# Patient Record
Sex: Male | Born: 1975 | Race: White | Marital: Married
Health system: Southern US, Community
[De-identification: ages and names within clinical notes are randomized; demographics above are authoritative.]

---

## 2014-05-31 ENCOUNTER — Other Ambulatory Visit: Payer: Self-pay | Admitting: Neurological Surgery

## 2014-05-31 DIAGNOSIS — M5412 Radiculopathy, cervical region: Secondary | ICD-10-CM

## 2014-06-10 ENCOUNTER — Other Ambulatory Visit: Payer: Self-pay

## 2014-06-24 ENCOUNTER — Ambulatory Visit
Admission: RE | Admit: 2014-06-24 | Discharge: 2014-06-24 | Disposition: A | Discharge status: Home / Self Care | Payer: BLUE CROSS/BLUE SHIELD | Source: Ambulatory Visit | Attending: Neurological Surgery | Admitting: Neurological Surgery

## 2014-06-24 DIAGNOSIS — M5412 Radiculopathy, cervical region: Secondary | ICD-10-CM

## 2018-12-11 ENCOUNTER — Other Ambulatory Visit (HOSPITAL_COMMUNITY): Payer: Self-pay | Admitting: Surgery

## 2018-12-17 ENCOUNTER — Other Ambulatory Visit (HOSPITAL_COMMUNITY): Payer: Self-pay | Admitting: Surgery

## 2018-12-17 ENCOUNTER — Other Ambulatory Visit: Payer: Self-pay

## 2018-12-17 ENCOUNTER — Ambulatory Visit (HOSPITAL_COMMUNITY)
Admission: RE | Admit: 2018-12-17 | Discharge: 2018-12-17 | Disposition: A | Discharge status: Home / Self Care | Payer: BC Managed Care – PPO | Source: Ambulatory Visit | Attending: Surgery | Admitting: Surgery

## 2019-01-20 ENCOUNTER — Other Ambulatory Visit: Payer: Self-pay

## 2019-01-20 ENCOUNTER — Encounter: Payer: Self-pay | Admitting: Skilled Nursing Facility1

## 2019-01-20 ENCOUNTER — Encounter: Payer: BLUE CROSS/BLUE SHIELD | Attending: Surgery | Admitting: Skilled Nursing Facility1

## 2019-01-20 DIAGNOSIS — E669 Obesity, unspecified: Secondary | ICD-10-CM | POA: Insufficient documentation

## 2019-01-20 NOTE — Progress Notes (Signed)
Pre-Op Assessment Visit:  Pre-Operative RYGB Surgery  Medical Nutrition Therapy:  Appt start time: 3:05  End time:  4:04  Patient was seen on 01/20/2019 for Pre-Operative Nutrition Assessment. Assessment and letter of approval faxed to [facility] Program coordinator on 01/20/2019    Referral Stated SWL Appointments Required: 6 (unless otherwise told by [facility])  Proposed Surgery Type: RYGB  Pt expectation of surgery: to lose weight  Pt expectation of dietitian: none identified   NUTRITION ASSESSMENT   Anthropometrics  Start weight at [facility]: 327.9 lbs Today's weight: 327.9 lbs BMI: 44.43 kg/m2     Psychosocial/Lifestyle Dx: depression, sleep apnea  Pt began the appointment irritated.   Pt states his depression had felt out of control and states it is due to his weight.  Pt states she has already been through this process so he hopes he does not need another 6 months and he already knows everything anyway.  Pt states he will use the fear of having had surgery to keep him from stepping out of the recommendations.  Pt states he will start talk therapy once his medication sets in. Pt states he is waiting for an appt to get in with a psychiatrist.  Pt states he knows he is a tress eater and it is all because of his weight. Pt states he believes he has a good support system at home.  Pt states he does wear his C-PAP nightly. Pt states he has GERD.  Pt seemed to relax by the end of the appt.   Medications: prozac  Labs:   Notable Signs/Symptoms Iritable (states due to depression)   24-Hr Dietary Recall First Meal: skipped Snack:  Second Meal: skipped Snack:  Third Meal: 6pm: pasta, chicken Snack: chips, cookies,  Beverages: diet soda, coffee, water    Physical Activity  Swimming 7 days a week 30 minutes   Estimated Energy Needs Calories: 1800 Carbohydrate: 200 Protein: 135 Fat: 50   NUTRITION DIAGNOSIS  Overweight/obesity ([place]-3.3)  related to past poor dietary habits and physical inactivity as evidenced by patient w/ planned RYGB surgery following dietary guidelines for continued weight loss.    NUTRITION INTERVENTION  Nutrition counseling (C-1) and education (E-2) to facilitate bariatric surgery goals.   Handouts given during visit include:  . Pre-Op Goals . Bariatric Surgery Protein Shakes . Vitamin and Mineral Options    During the appointment today the following Pre-Op Goals were reviewed with the patient: . Log your food and beverage via an app or pen and paper  . Make healthy food choices . Begin to limit portion sizes . Limited concentrated sugars and fried foods . Keep fat/sugar in the single digits per serving on              food labels . Practice CHEWING your food  (aim for 30 chews per bite or until applesauce consistency) . Practice not drinking 15 minutes before, during, and 30 minutes after each meal/snack . Avoid all carbonated beverages  . Avoid/limit caffeinated beverages  . Avoid all sugar-sweetened beverages . Consume 3 meals per day; eat every 3-5 hours . Make a list of non-food related activities . Aim for 64-100 ounces of FLUID daily  . Aim for at least 60-80 grams of PROTEIN daily . Look for a liquid protein source that contain ?15 g protein and ?5 g carbohydrate  (ex: shakes, drinks, shots)   Change readiness: Contemplative  Demonstrated degree of understanding via: Teach Back  MONITORING & EVALUATION Dietary intake, weekly physical activity, body weight, and pre-op goals reached.    Next Steps  Patient is to call [facility] (once surgery date is scheduled) to be scheduled for Pre-Op Class, which must be at least 2 weeks prior to surgery date or back follow up visit in 1 month

## 2019-02-16 ENCOUNTER — Ambulatory Visit (INDEPENDENT_AMBULATORY_CARE_PROVIDER_SITE_OTHER): Payer: BC Managed Care – PPO | Admitting: Psychology

## 2019-02-16 DIAGNOSIS — F509 Eating disorder, unspecified: Secondary | ICD-10-CM

## 2019-02-24 ENCOUNTER — Other Ambulatory Visit: Payer: Self-pay

## 2019-02-24 ENCOUNTER — Encounter: Payer: Self-pay | Admitting: Dietician

## 2019-02-24 ENCOUNTER — Encounter: Payer: BLUE CROSS/BLUE SHIELD | Attending: Surgery | Admitting: Dietician

## 2019-02-24 DIAGNOSIS — E669 Obesity, unspecified: Secondary | ICD-10-CM | POA: Insufficient documentation

## 2019-02-24 NOTE — Progress Notes (Signed)
Bariatric Supervised Weight Loss Visit Appt Start Time: 2:45pm    End Time: 3:30pm  Planned Surgery: RYGB  Pt Expectation of Surgery/ Goals: to lose weight and be healthy  1st SWL Appointment  Referral originally indicated 6 SWL visits were required, however pt states he spoke with [facility] and determined his previous visits with PCP will fulfill the SWL visit requirements.    NUTRITION ASSESSMENT  Anthropometrics  Start weight at [facility]: 327.9 lbs (date: 01/20/2019) Today's weight: 328.8 lbs Weight change: +1 lb (since previous visit on 01/20/2019) BMI: 44.6 kg/m2    Clinical  Medical Hx: depression, sleep apnea  Medications: Prozac   Lifestyle & Dietary Hx Pt is talkative, and spent the entire 45 minute appointment venting about his frustration with this surgery process and how his psych evaluation went. States he was told he has binge eating disorder and is upset because of this. States that he knows he "binge eats" but does not look at it as a disorder. States he is aware of what healthy/unhealthy foods are and that he knows what he should and should not eat but makes his own decisions anyway. States fear is his motivator for surgery, and that knowing he could physically be in pain or "mess something up" if he eats too much or the wrong thing that these things are motivators to him, not just knowing the right thing to do and not doing it. Pt went on as though he feels like he needs to explain himself and prove that he is ready for surgery, despite not being willing to make any dietary, lifestyle, or mental changes prior to surgery. Rather, surgery will force him to make changes. Current meal pattern is only 1 meal per day, usually no snacks, and very rarely eats breakfast (may have toast or biscuit on the weekends.)   24-Hr Dietary Recall First Meal: usually skips (or toast, or biscuit and gravy)  Snack: skips Second Meal: skips Snack: skips  Third Meal: grilled meat + potato + vegetable   Snack: cookies  Beverages: water, coffee, diet soda   Estimated Energy Needs Calories: 1800 Carbohydrate: 200g Protein: 135g Fat: 50g   NUTRITION DIAGNOSIS  Overweight/obesity ([place]-3.3) related to past poor dietary habits and physical inactivity as evidenced by patient w/ planned RYGB surgery following dietary guidelines for continued weight loss.   NUTRITION INTERVENTION  Nutrition counseling (C-1) and education (E-2) to facilitate bariatric surgery goals.  Handouts Provided Include   Pre-Op Goals  Learning Style & Readiness for Change Teaching method utilized: Visual & Auditory  Demonstrated degree of understanding via: Teach Back  Barriers to learning/adherence to lifestyle change: Contemplative Stage of Change, Depression   MONITORING & EVALUATION Dietary intake, weekly physical activity, body weight, and pre-op goals.   Next Steps  Patient is to return to [facility] for Pre-Op Class >2 weeks prior to surgery once surgery date is determined.

## 2019-02-26 ENCOUNTER — Ambulatory Visit: Payer: BC Managed Care – PPO | Admitting: Psychology

## 2019-03-01 ENCOUNTER — Ambulatory Visit: Payer: BC Managed Care – PPO | Admitting: Psychology

## 2019-03-15 ENCOUNTER — Ambulatory Visit: Payer: BC Managed Care – PPO | Admitting: Psychology

## 2019-03-23 ENCOUNTER — Ambulatory Visit: Payer: BC Managed Care – PPO | Admitting: Psychology

## 2019-04-06 ENCOUNTER — Ambulatory Visit: Payer: BC Managed Care – PPO | Admitting: Psychology

## 2021-02-10 IMAGING — CR CHEST - 2 VIEW
2 series · 2 of 2 positions shown · non-contrast
Comparison: None.

CLINICAL DATA: Preop for bariatric surgery

EXAM:
CHEST - 2 VIEW

[w chest pa]
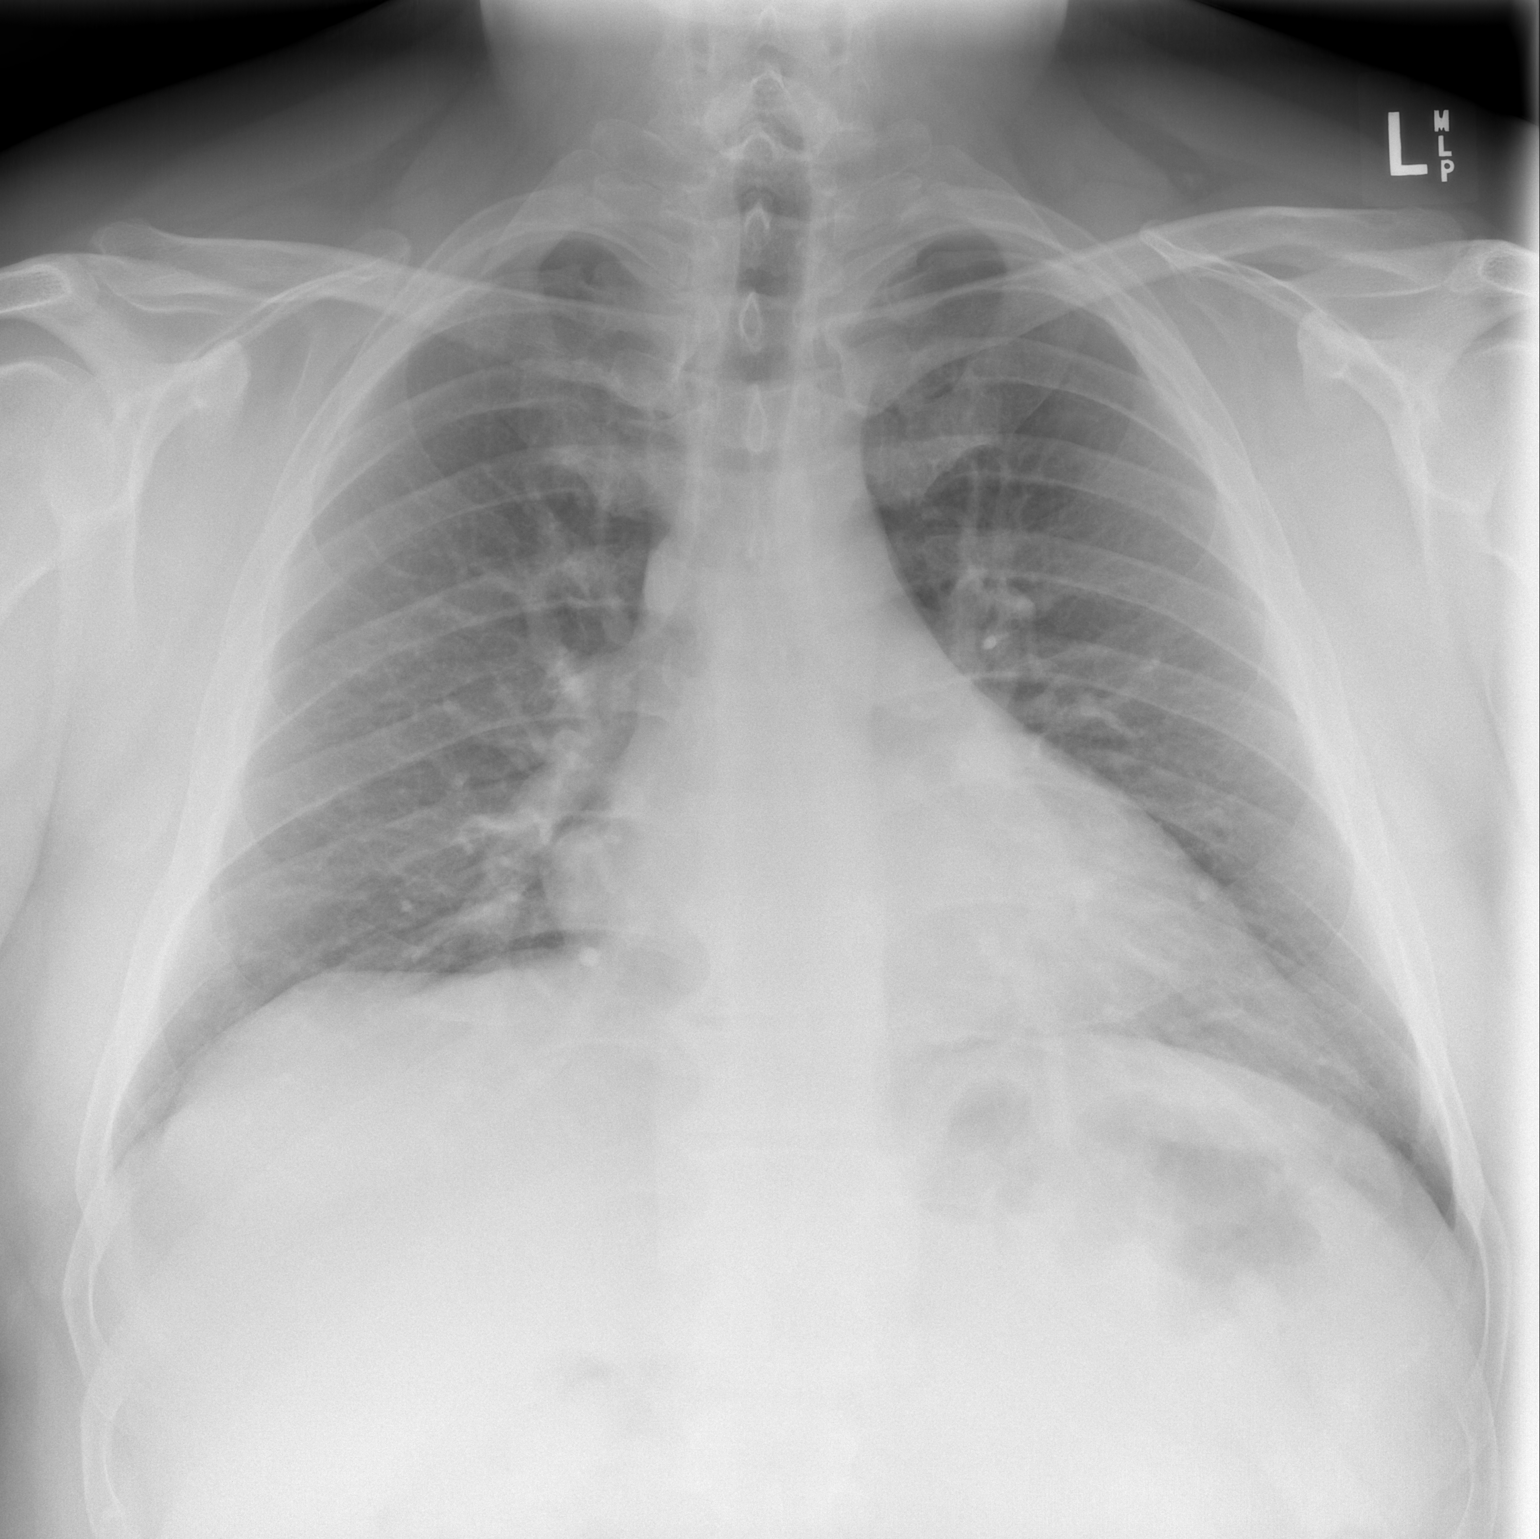

[w chest lat]
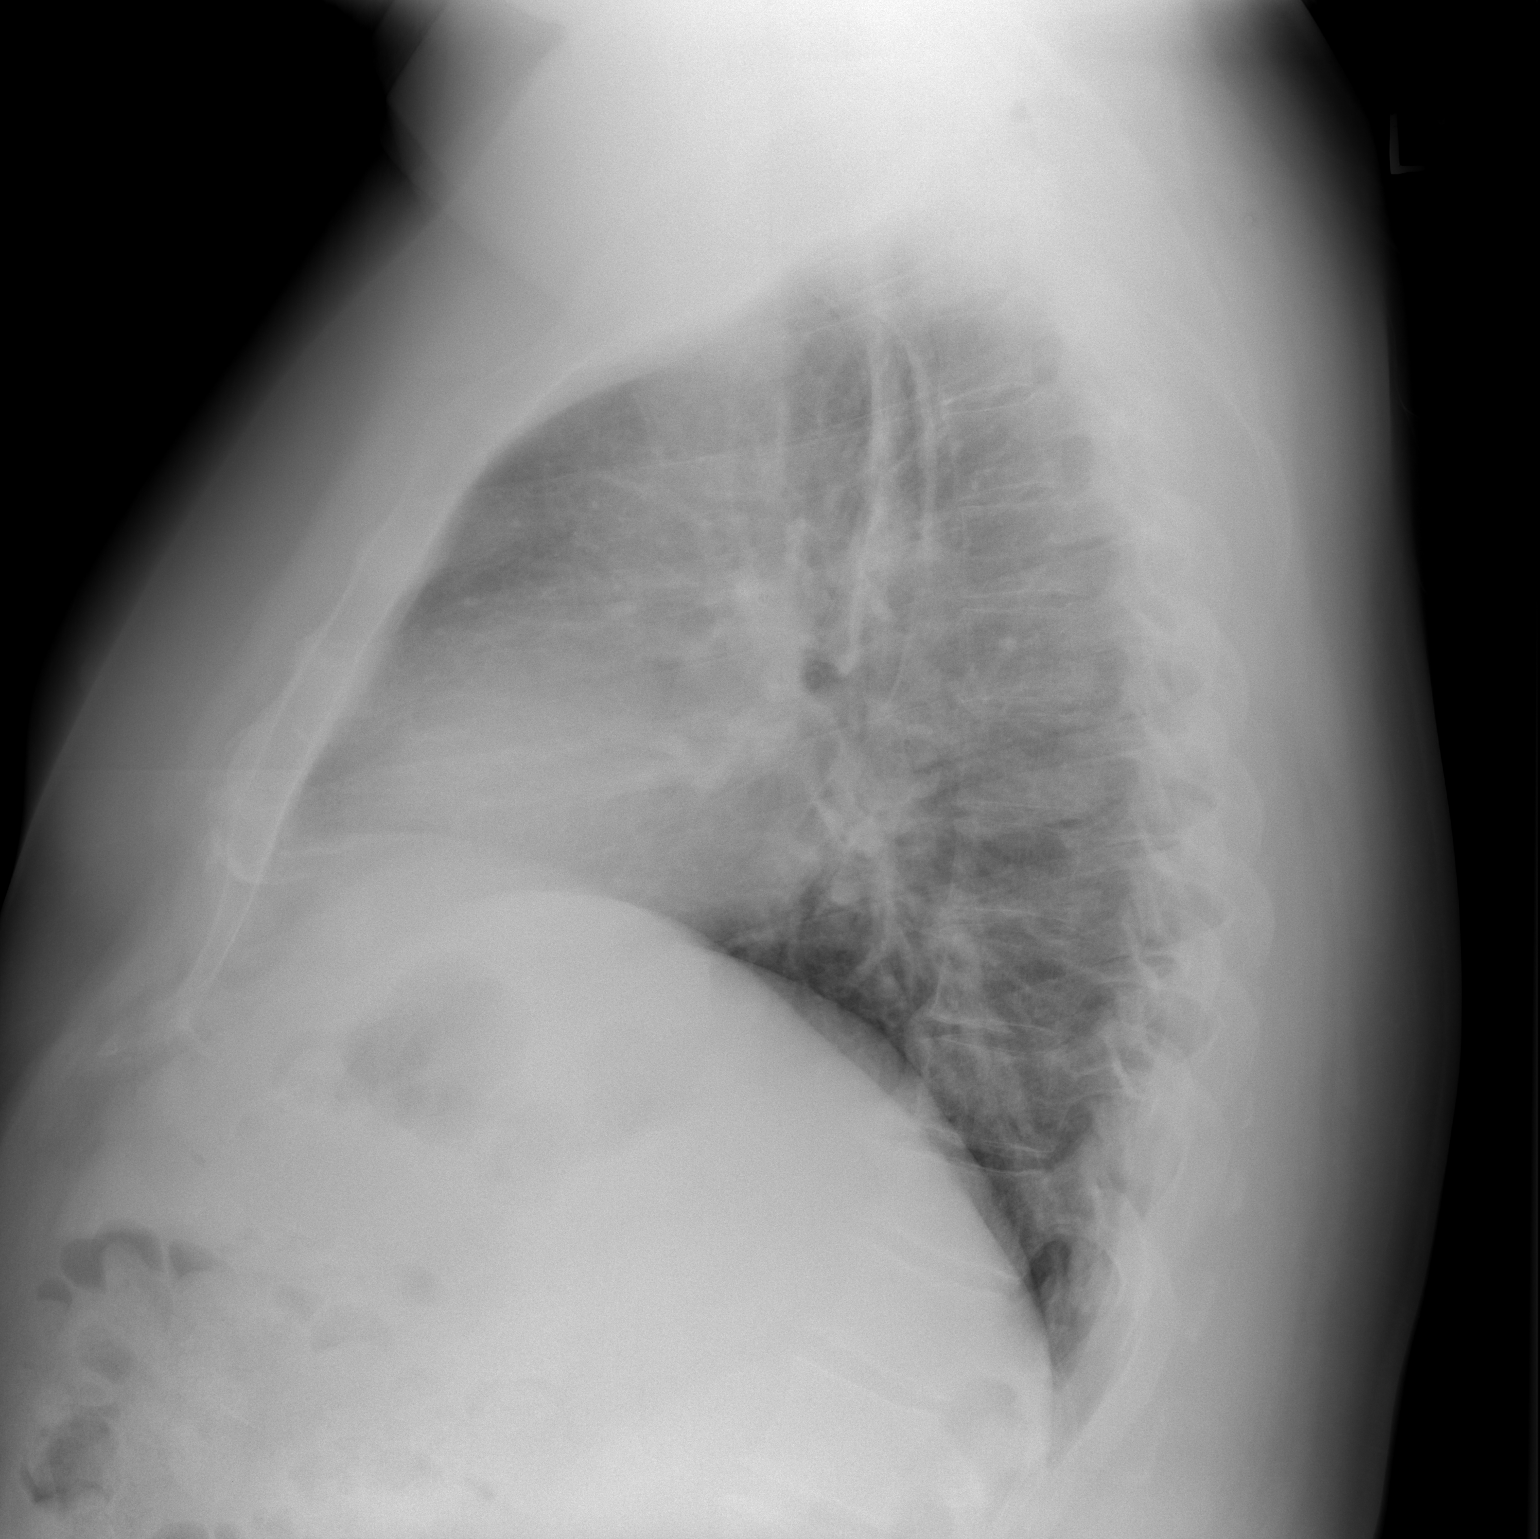

[2 of 2 positions shown; findings below may reference images not displayed]

FINDINGS: The heart size and mediastinal contours are within normal limits.
Both lungs are clear. The visualized skeletal structures are
unremarkable.
IMPRESSION: No active cardiopulmonary disease.

## 2021-02-10 IMAGING — RF UPPER GI SERIES (WITHOUT KUB)
10 of 11 series · 15 of 20 positions shown · non-contrast
Comparison: None.

CLINICAL DATA: Pre bariatric surgery evaluation

EXAM:
UPPER GI SERIES WITH KUB
TECHNIQUE: After obtaining a scout radiograph a routine upper GI series was
performed using thin barium
FLUOROSCOPY TIME:  Fluoroscopy Time:  1.2 minute
Radiation Exposure Index (if provided by the fluoroscopic device):
190.1 mGy
Number of Acquired Spot Images: 0

[Series 1: t abdomen supine · 0.15mm/px · 1 of 1 slices shown]
[im 1/1]
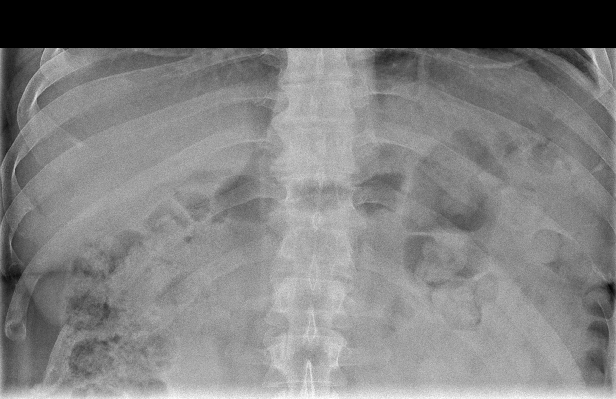

[Series 3: fluoro_barium 2fps_bw · 0.17mm/px · 2 of 2 frames shown (1 of 9)]
[frame 1/2]
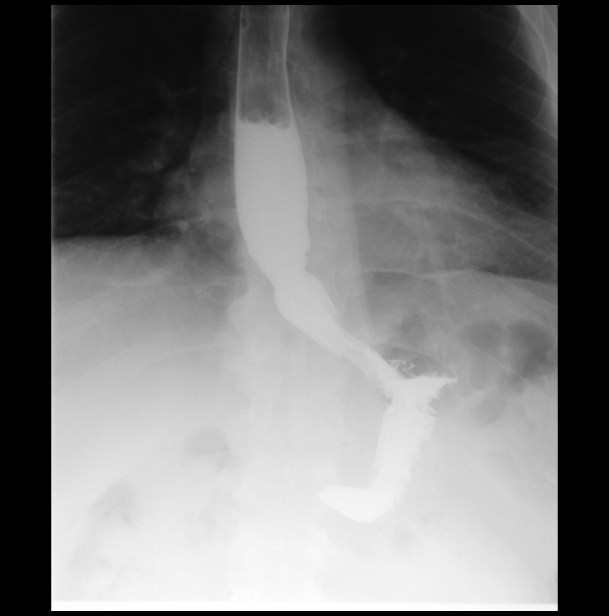
[frame 2/2]
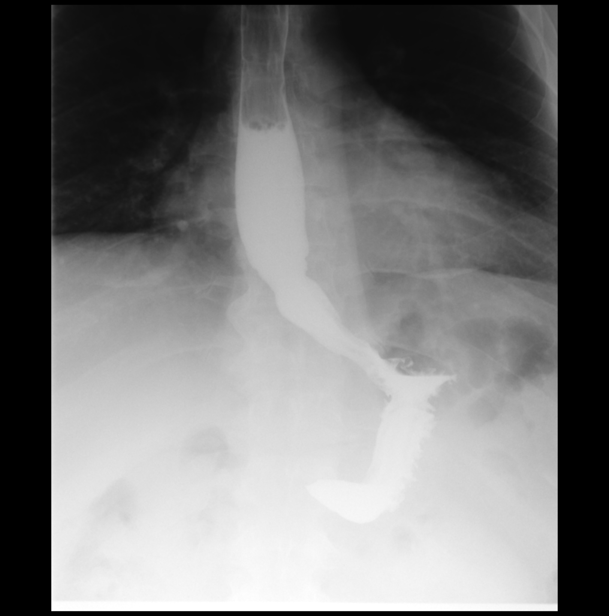

[Series 4: fluoro_barium 2fps_bw · 0.17mm/px · 1 of 2 frames shown (2 of 9)]
[frame 1/2]
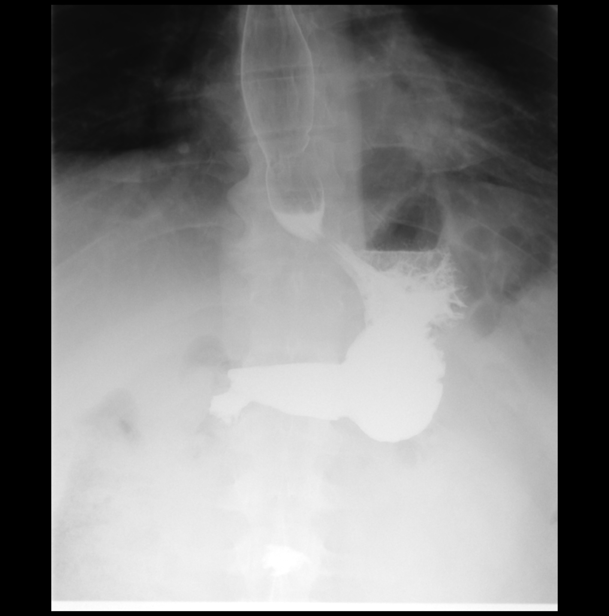

[Series 5: fluoro_barium 2fps_bw · 0.17mm/px · 2 of 2 frames shown (3 of 9)]
[frame 1/2]
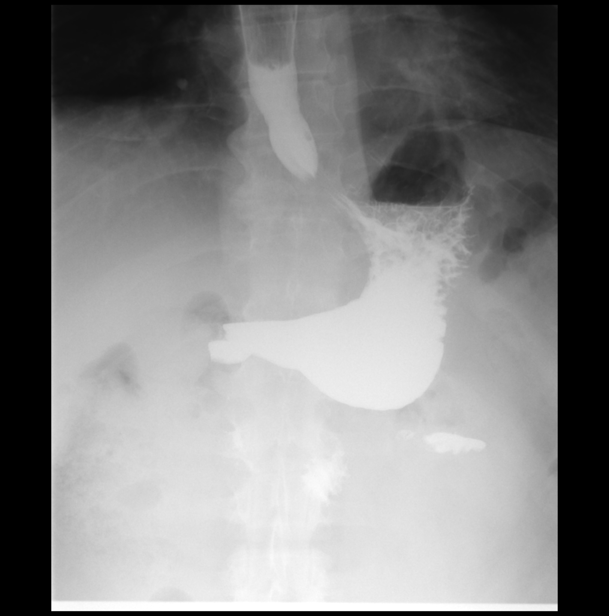
[frame 2/2]
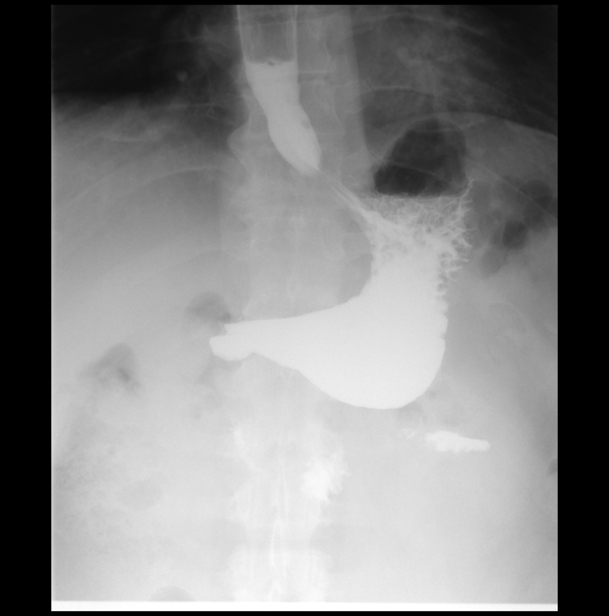

[Series 6: fluoro_barium 2fps_bw · 0.17mm/px · 1 of 2 frames shown (4 of 9)]
[frame 1/2]
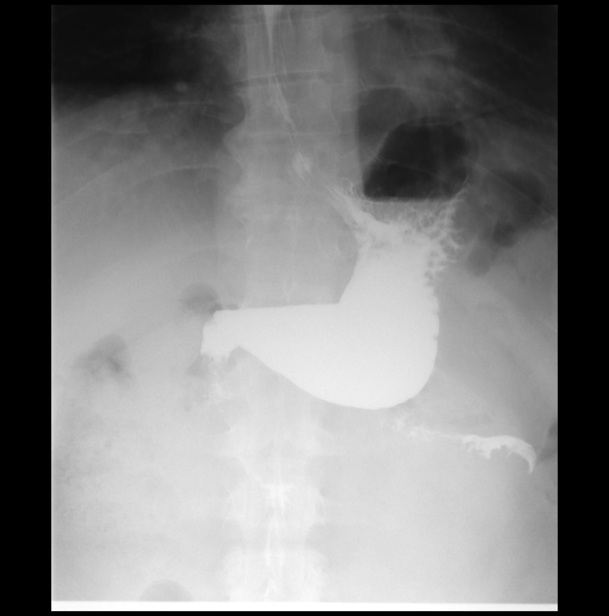

[Series 7: fluoro_barium 2fps_bw · 0.17mm/px · 2 of 2 frames shown (5 of 9)]
[frame 1/2]
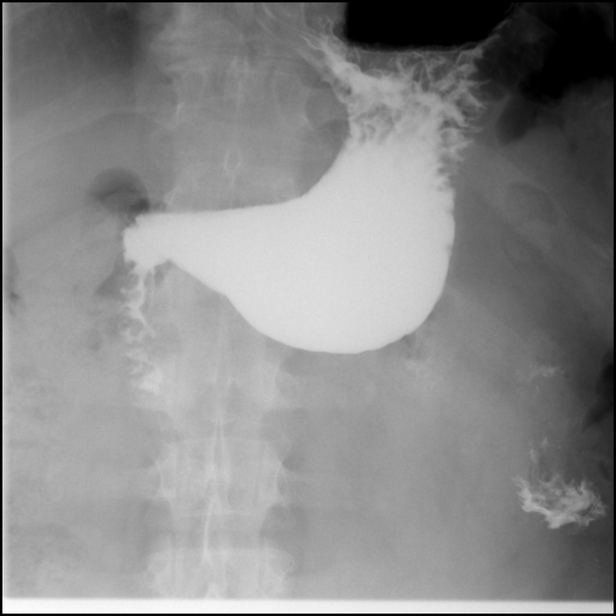
[frame 2/2]
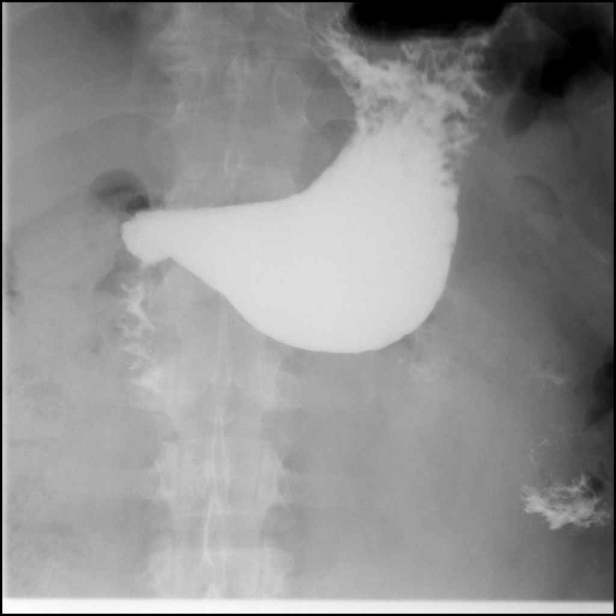

[Series 8: fluoro_barium 2fps_bw · 0.17mm/px · 1 of 2 frames shown (6 of 9)]
[frame 1/2]
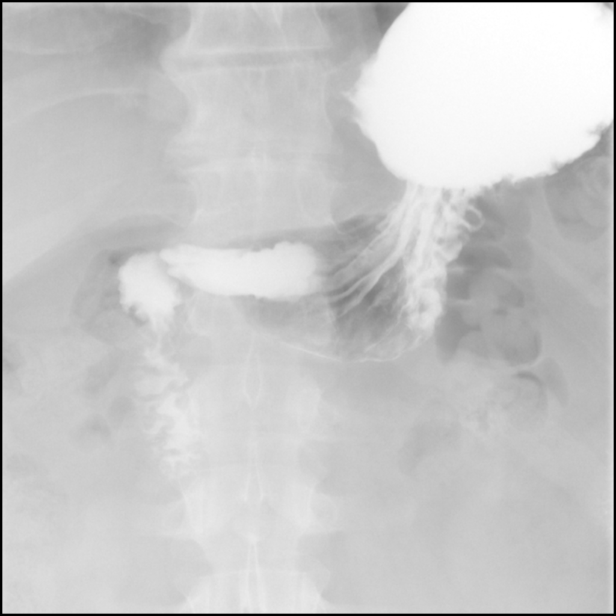

[Series 9: fluoro_barium 2fps_bw · 0.17mm/px · 2 of 2 frames shown (7 of 9)]
[frame 1/2]
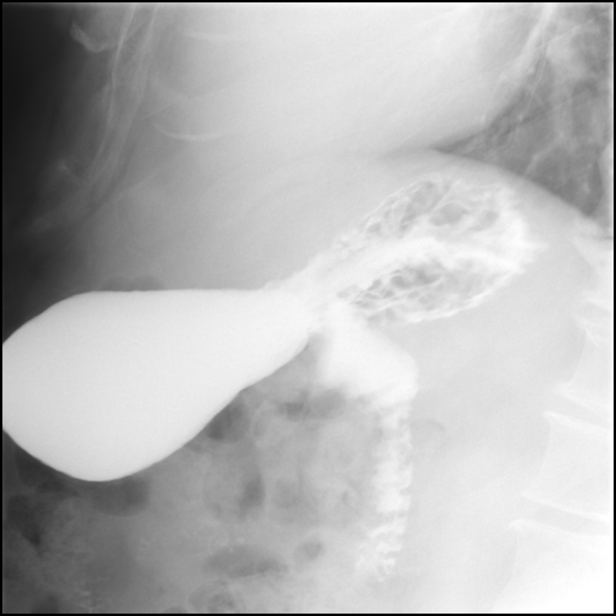
[frame 2/2]
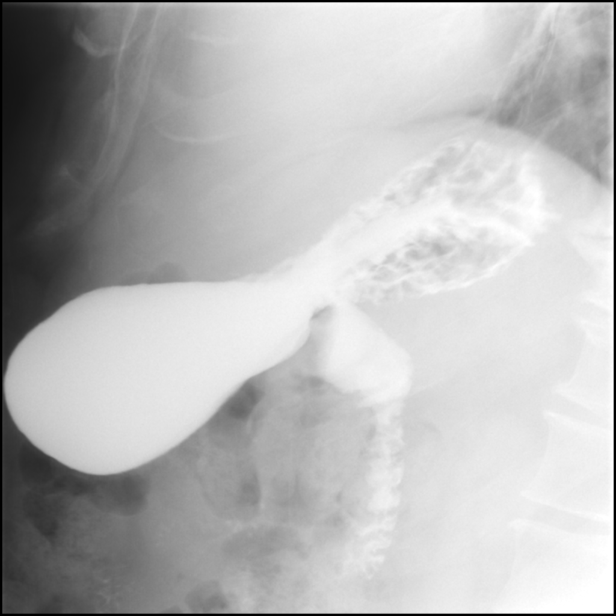

[Series 10: fluoro_barium 2fps_bw · 0.17mm/px · 1 of 2 frames shown (8 of 9)]
[frame 1/2]
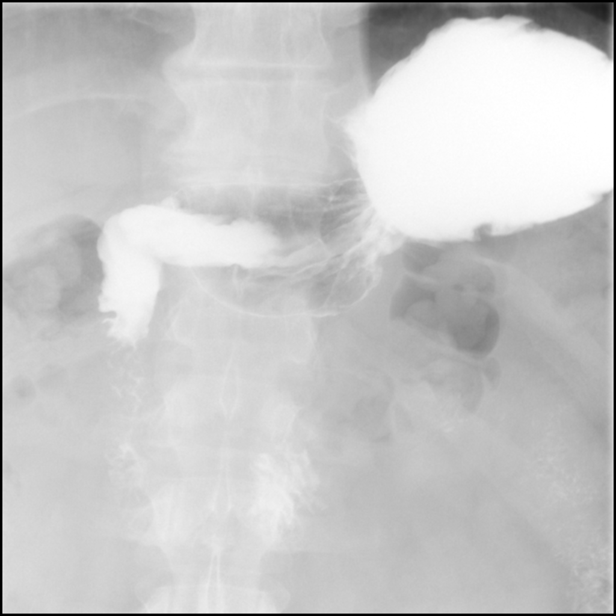

[Series 11: fluoro_barium 2fps_bw · 0.17mm/px · 2 of 2 frames shown (9 of 9)]
[frame 1/2]
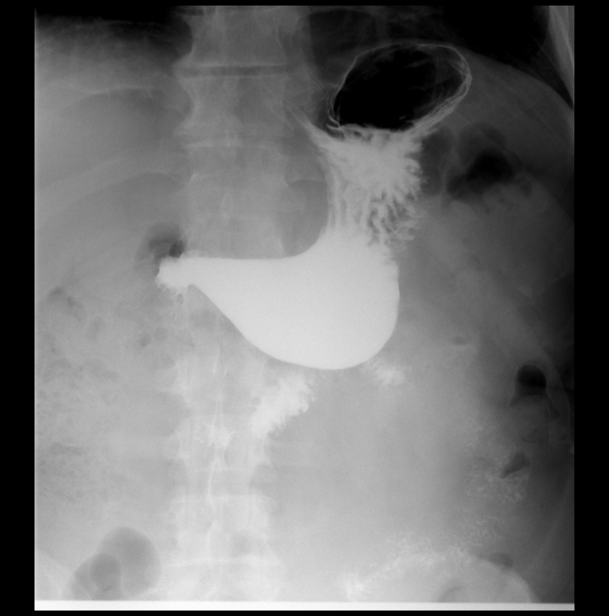
[frame 2/2]
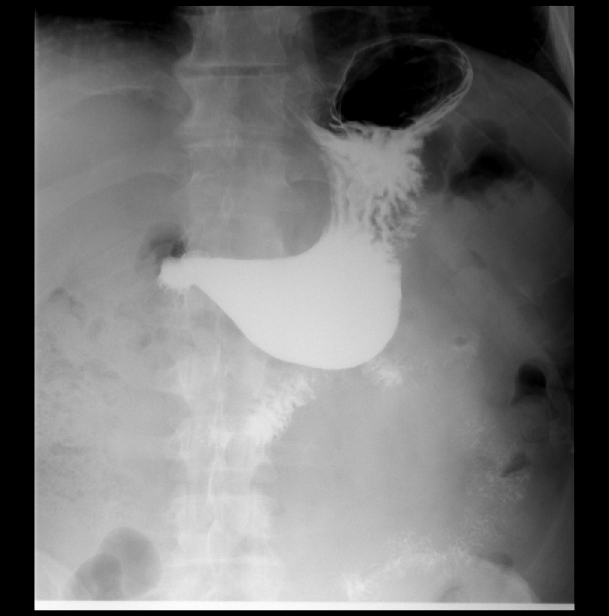

[15 of 20 positions shown; findings below may reference images not displayed]

FINDINGS: Contrast readily flowed through the esophagus into the stomach.
Normal GE junction. Stomach is normal orientation. The first,
second, and third portion duodenum are normal. The fourth portion
duodenum is in expected location LEFT of the spine.
IMPRESSION: Normal anatomy of the esophagus, stomach and duodenum.

Ligament Treitz in expected location

## 2021-09-13 ENCOUNTER — Ambulatory Visit (HOSPITAL_COMMUNITY)
Admission: EM | Admit: 2021-09-13 | Discharge: 2021-09-13 | Discharge status: Against Medical Advice | Payer: BLUE CROSS/BLUE SHIELD

## 2021-09-13 NOTE — ED Notes (Signed)
Pt left prior to being seen by provider

## 2021-09-13 NOTE — Progress Notes (Signed)
Patient presents to the [facility] seeking help for his aniety and depression.  Patient states that his PCP is treating his depression and anxiety with Prozac and Xanax.  Patient states that he feels like the medication is not working.  He states that he has fluctuations in his mood, sometimes he feels really good, other times empty.  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  Patient states that he has a lot of psycho-social stressors going on as well as a recent death of a grandparent.  Patient states that he is married and has two children.  Patient states that he is not suicidal, homicidal orpsychotic.  Patient denies any alcohol or drug use other than CBD for sleep.  Patient is routine. ?

## 2022-04-09 ENCOUNTER — Ambulatory Visit (HOSPITAL_COMMUNITY)
Admission: EM | Admit: 2022-04-09 | Discharge: 2022-04-09 | Disposition: A | Discharge status: Home / Self Care | Payer: BC Managed Care – PPO | Attending: Psychiatry | Admitting: Psychiatry

## 2022-04-09 ENCOUNTER — Telehealth (HOSPITAL_COMMUNITY): Payer: Self-pay | Admitting: Professional

## 2022-04-09 ENCOUNTER — Inpatient Hospital Stay (HOSPITAL_COMMUNITY): Admit: 2022-04-09 | Payer: BLUE CROSS/BLUE SHIELD | Admitting: Psychiatry

## 2022-04-09 DIAGNOSIS — F332 Major depressive disorder, recurrent severe without psychotic features: Secondary | ICD-10-CM | POA: Insufficient documentation

## 2022-04-09 DIAGNOSIS — U071 COVID-19: Secondary | ICD-10-CM | POA: Diagnosis not present

## 2022-04-09 LAB — CBC WITH DIFFERENTIAL/PLATELET
Abs Immature Granulocytes: 0.02 10*3/uL (ref 0.00–0.07)
Basophils Absolute: 0.1 10*3/uL (ref 0.0–0.1)
Basophils Relative: 1 %
Eosinophils Absolute: 0 10*3/uL (ref 0.0–0.5)
Eosinophils Relative: 0 %
HCT: 47.6 % (ref 39.0–52.0)
Hemoglobin: 15.9 g/dL (ref 13.0–17.0)
Immature Granulocytes: 0 %
Lymphocytes Relative: 16 %
Lymphs Abs: 1.1 10*3/uL (ref 0.7–4.0)
MCH: 28.8 pg (ref 26.0–34.0)
MCHC: 33.4 g/dL (ref 30.0–36.0)
MCV: 86.2 fL (ref 80.0–100.0)
Monocytes Absolute: 0.6 10*3/uL (ref 0.1–1.0)
Monocytes Relative: 8 %
Neutro Abs: 5.2 10*3/uL (ref 1.7–7.7)
Neutrophils Relative %: 75 %
Platelets: 281 10*3/uL (ref 150–400)
RBC: 5.52 MIL/uL (ref 4.22–5.81)
RDW: 12.8 % (ref 11.5–15.5)
WBC: 7 10*3/uL (ref 4.0–10.5)
nRBC: 0 % (ref 0.0–0.2)

## 2022-04-09 LAB — COMPREHENSIVE METABOLIC PANEL
ALT: 17 U/L (ref 0–44)
AST: 17 U/L (ref 15–41)
Albumin: 4.2 g/dL (ref 3.5–5.0)
Alkaline Phosphatase: 65 U/L (ref 38–126)
Anion gap: 13 (ref 5–15)
BUN: 8 mg/dL (ref 6–20)
CO2: 27 mmol/L (ref 22–32)
Calcium: 9.2 mg/dL (ref 8.9–10.3)
Chloride: 98 mmol/L (ref 98–111)
Creatinine, Ser: 0.85 mg/dL (ref 0.61–1.24)
GFR, Estimated: >60 mL/min (ref >60)
Glucose, Bld: 85 mg/dL (ref 70–99)
Potassium: 3.8 mmol/L (ref 3.5–5.1)
Sodium: 138 mmol/L (ref 135–145)
Total Bilirubin: 1.7 mg/dL — ABNORMAL HIGH (ref 0.3–1.2)
Total Protein: 7.4 g/dL (ref 6.5–8.1)

## 2022-04-09 LAB — RESP PANEL BY RT-PCR (FLU A&B, COVID) ARPGX2
Influenza A by PCR: NEGATIVE
Influenza B by PCR: NEGATIVE
SARS Coronavirus 2 by RT PCR: POSITIVE — AB

## 2022-04-09 LAB — TSH: TSH: 0.797 u[IU]/mL (ref 0.350–4.500)

## 2022-04-09 NOTE — Progress Notes (Signed)
Pt was accepted to [facility] TODAY 04/09/22 PENDING labs, covid PCR, and voluntary consent signed and faxed to [phone]; Bed Assignment 300-1  Pt meets inpatient criteria per Lamar Sprinkles, MD,  Attending Physician will be Dr. Sherron Flemings  Report can be called to: Adult unit: [phone]  Pt can arrive after PENDING items  Care Team notified: [facility] Rona Ravens, RN, Lamar Sprinkles, MD, Rex Kras, RN, Durwin Reges, RN  Kelton Pillar, LCSWA 04/09/2022 @ 12:27 PM

## 2022-04-09 NOTE — ED Provider Notes (Signed)
Behavioral Health Urgent Care Medical Screening Exam  Patient Name: Nathan Reyes MRN: [id number] Date of Evaluation: 04/09/22 Chief Complaint:   Diagnosis:  Final diagnoses:  Severe episode of recurrent major depressive disorder, without psychotic features (HCC)    History of Present illness: Nathan Reyes is a 46 y.o. male with psychiatric history of MDD- recurrent/severe/without psychotic features and remote hx of ADHD who presented to the [facility] voluntarily with his wife for worsening depression and medication problems.   Patient reports that he was taking Effexor for nearly 1 year, which was initially working well for him at 75 mg, then began to lose its efficacy about one month ago, when he noticed worsening of depressive sx including anhedonia, low mood and irritability, poor focus, poor sleep, psychomotor agitation, and passive SI without plan/intent. 2 weeks later, he saw a provider via [facility] who scheduled a 2 week taper of the Effexor as well as Xanax (which patient has been taking 1 mg nightly for months). He was started on Wellbutrin 150 mg while completing the taper. At the end of this taper, patient reports that he began feeling physically unwell which led to further worsening of his depressive sx. He saw his PCP yesterday, 11/20, who advised that the taper was done inappropriately and restarted on Effexor 75 mg. Patient feels as though he is unable to function in his current state and presented today to get help. Patient declines safety concerns at home, but his wife reports that the way his SI has been progressing, she fears that it will advance from passive to active.   Patient denies history of manic symptoms, trauma leading to PTSD sx, and current tobacco, alcohol, nor illicit substance use. He recently used CBD vape pens for sleep, but has not used one in weeks. He denies psychotic symptoms including AVH and paranoia, and does not voice delusions. Patient does  report intrusive, obsessive thoughts as it pertains to his depressive sx, his eating habits (losing over 100 lbs over the past year), and organization.   Patient inquires about whether he should start a stimulant, at the advising of his PCP. He is advised that this is not the best option at the moment, and he voices understanding. Patient also states that he believes an inpatient admission is warranted because he has had worsening of his symptoms to this point.   Psychiatric Specialty Exam  Presentation  General Appearance:Appropriate for Environment; Casual  Eye Contact:Good  Speech:Clear and Coherent; Normal Rate  Speech Volume:Normal  Handedness:Right   Mood and Affect  Mood: Depressed; Hopeless  Affect: Congruent; Depressed; Tearful   Thought Process  Thought Processes: Coherent; Linear  Descriptions of Associations:Intact  Orientation:Full (Time, Place and Person)  Thought Content:Logical; WDL    Hallucinations:None  Ideas of Reference:None  Suicidal Thoughts:Yes, Passive Without Intent; Without Plan  Homicidal Thoughts:No   Sensorium  Memory: Immediate Good; Recent Good  Judgment: Fair  Insight: Good   Executive Functions  Concentration: Good  Attention Span: Good  Recall: Good  Fund of Knowledge: Good  Language: Good   Psychomotor Activity  Psychomotor Activity: Normal   Assets  Assets: Communication Skills; Desire for Improvement; Financial Resources/Insurance; Housing; Intimacy; Leisure Time; Physical Health; Social Support; Transportation; Vocational/Educational   Sleep  Sleep: Poor    Physical Exam: Physical Exam Vitals reviewed.  Constitutional:      General: He is not in acute distress.    Appearance: He is normal weight.  HENT:     Head: Normocephalic and  atraumatic.     Mouth/Throat:     Mouth: Mucous membranes are moist.     Pharynx: Oropharynx is clear.  Pulmonary:     Effort: Pulmonary effort is  normal.  Skin:    General: Skin is warm and dry.  Neurological:     General: No focal deficit present.     Mental Status: He is alert and oriented to person, place, and time.     Motor: No weakness.     Gait: Gait normal.    Review of Systems  Constitutional:  Positive for malaise/fatigue. Negative for diaphoresis and fever.  HENT:  Positive for sinus pain.   Cardiovascular:  Negative for chest pain and palpitations.  Neurological:  Positive for dizziness and headaches.   Blood pressure (!) 135/91, pulse 87, resp. rate 18, SpO2 100 %. There is no height or weight on file to calculate BMI.  Musculoskeletal: Strength & Muscle Tone: within normal limits Gait & Station: normal Patient leans: N/A   [facility] MSE Discharge Disposition for Follow up and Recommendations: Based on my evaluation I certify that psychiatric inpatient services furnished can reasonably be expected to improve the patient's condition which I recommend transfer to an appropriate accepting facility.  Patient was initially accepted to [facility], but after a positive COVID swab resulted, he was discharged home and set up with PHP: You are scheduled for an assessment for the PHP on 04/16/22 @ 10a. This appointment will last approximately one hour and will be virtual via Web[employer]. PHP is virtual group therapy that runs Mon-Fri from 9am-1pm. Please download the [employer] app prior to the appointment. If you need to cancel or reschedule, please call [phone].   Lamar Sprinkles, MD 04/09/2022, 5:36 PM

## 2022-04-09 NOTE — Progress Notes (Signed)
Pt is currently COVID positive and will be connected with outpatient resources.   Maryjean Ka, MSW, LCSWA 04/09/2022 2:03 PM

## 2022-04-09 NOTE — Discharge Instructions (Addendum)
It is imperative that you follow through with treatment recommendations within 5-7 days from the day of discharge to mitigate further risk to your safety and overall mental well-being.  A list of outpatient therapy and psychiatric providers for medication management has been provided below to get you started in finding the right provider for you.  In case of an urgent emergency, you have the option of contacting the Mobile Crisis Unit with [facility] at [phone].  Right now, we are discharging you on your prior doses of venlafaxine and alprazolam - this should help reduce the physical withdrawal symptoms. I feel that some of the symptoms you felt were withdrawal are likely due to COVID, so this might not go away entirely. We have put in an urgent referral to the [facility] program here at [facility] - you should get a call to enroll tomorrow. This will provide you with therapy and med management directed towards treatment of your depression. The phone number should be in your discharge packet.           [facility] [address] [place], [place], [address] [phone] phone (Medicare, Private insurance except Tricare, [place], and [facility])  [place] -------- 6-8 month wait for first therapy appointment; may be sooner for medication management. [address] [place], [place], [address] [phone] phone ([address] [place], [place], [place], [place], Friday Health Plans, [place], [place], [place], [place], [place], [place], [place], [place], [place], [facility], The [employer], [place])  [place] ------- THERAPY ONLY - February 2024 for the first appointment to complete the assessment before being assigned a therapist [address] [place], [place], [address] [phone] phone [phone] phone [phone] fax ([address] [place], [place],  [facility])  [facility] [address] [place], [place], [address] [phone] phone (Call to confirm insurance coverage) Consultation & Support Services     o Drop-In Hours: 1:00 PM to 5:00 PM     o Days: Monday - Thursday  Crisis Services (24/7)   [facility] [address] [place], [place], [address] [phone] phone ([address] [place], [place], [place] Medicaid, [place] and [place], [facility])      [facility] [address], [place], [place], [address] [phone] phone [email or web address]  (to complete the intake form and upload ID and insurance cards)  [address] [place], [place], [address] [phone] phone ([place], [place], [place], [place], [place], [place], [facility] [place], [place], and certain Medicaid plans)  [place]. [address], [place], [place], [address] [phone] phone [phone] fax (Medicaid, Medicare, Self-pay, call about other insurance coverage)  [facility] (age 70+) [address] [place], [place], [address] [phone] hone [phone] fax ([place], MedCost, [place], [place], [place], [place], [place], [place], [place], certain Standard Pacific, [facility] [place])  [place], [place], [address] [phone] phone (Medicare, Medicaid, Beaulah Dinning, call about other insurance coverage)  [place] [address] [place], [place], [address] [phone] phone [phone] fax (Call [phone] to see what insurance is accepted) Norma Fredrickson, MD specializes in geropsych)  [facility]  (medication management only) [address] [place], [place], [address] [phone] phone [phone] fax ([address], Medicaid, [place], [place], [place],  [place], [place], [place], [place])  [facility] (medication  management only) [address] [place], [place], [address] [phone] phone [phone] fax ([address], [place], [place], Tricare [place])  [facility] [address] [place], [place], [address] [phone] phone [phone] fax ([address], [place], [place], [place], [place], [facility], [facility] Medicaid/[place] Health Choice)  [facility] [place], [employer]. [address] [place], [place], [address] [phone] phone [phone] fax (Medicare, Medicaid, [facility])  [facility] [address] [place], [place] [address] [phone] phone ([address], [place], [place], [place], [place], Medicare, [place], [facility]) Does genetic testing for medications; does transcranial magnetic stimulation along with basic services)  [facility] [address] [place], [place], [address] [phone] phone (Call about insurance coverage)  [facility] [address] [place], [place], [address] [phone] phone [phone] fax (Call about insurance coverage)  [facility] [address] [place], [place], [address] [phone] phone [phone] fax (Call about insurance coverage)  [employer] [phone] [address] [place], [address] [phone] phone (Medicaid, Tricare, [place], [place], [place])  [employer] [address] Ginette [place], [place], [address] [phone] phone (Medicaid, Medicare, call about other insurance coverage)  [facility] [address] [place], [place], [address] [phone] phone [phone] fax (Medicaid, Medicare, Tricare, call about other insurance coverage)  [facility] [address], [place], [address] [phone] phone ([address], [place], Penn State Berks, [place], [place] types - Alliance, Secretary/administrator, Partners, [place], [place] Health Choice,  Healthy [place], [place], [place], and Complete)  [facility] [address] [place], [place], [address] [phone] phone Completely online treatment platform Contact: [place] - [facility] Specialist [phone] phone [phone] fax ([address], [place], [place], Friday Health Plan, [place], [place], [place], [place], [place], [place])   It is imperative that you follow through with treatment recommendations within 5-7 days from the day of discharge to mitigate further risk to your safety and overall mental well-being.  A list of outpatient therapy and psychiatric providers for medication management has been provided below to get you started in finding the right provider for you.           [facility] [address] [place], [place], [address] [phone] phone (BCBS, [place]; for individuals without insurance, please call and speak to our staff)  [facility] [address] [place], [place], [address] [address] phone ([address] [employer] [address] - [place], [address], [place], [place], Friday Health Plans, [address], BCBS Healthy [place], [place], [address], [place], [place], [place], [place], [place], [facility], [facility] Federal-Mogul, Eli Lilly and Company)  [employer], [facility] [address] [place], [place], [address] [phone] phone (Aetna/Aetna [place], BHS/Behavioral Health Systems, [place], [place], [address], [place], ComPsych, Health Choice, Health Net, [place], MHN/Health Net, [place], [place], [place])  [facility] [address] [place], [place], [address] [phone] phone - MUST CALL TO COMPLETE A PHONE ASSESSMENT [phone] fax PHP - 9am-3pm, 5 days/week (in-person); IOP - 9am-12pm, 3 days/week (virtual; you choose the days) (BCBS, [place], [place], [place], [place], [place])  [facility] [address], [place],  [address] [phone] phone [phone] fax (Medicare, call to verify other insurance coverage)  [facility] [address] [place], [place], [address] [phone] phone [phone] fax Also does DBT, Trauma Focused CBT, Trauma Groups, Support Groups (BCBS, self-pay, or scholarship)  [facility] [address] [place], [place], [address] [phone] phone Also does DBT, Radical Open DBT, EMDR,  and [place] ([address], [place], [place], [place], [place], [place])  [facility] [address] [place], [place], [address]  [phone] phone [phone] fax Also does EMDR, Child First, and trauma work (Medicaid, [place], [facility], [place], [place] Choice, reduced rates for self-pay)  [facility] ([place]) [address] [place], [place], [address] [phone] phone [phone] fax  [place] ([place]) [address] [place], [place], [address] [phone] phone ([address], [place], [place], [place], Friday Health, [facility])  [facility] [address] [place], [place], [address] [phone] phone ([place], [place], and Innovations; [place], UHC, [employer]W [employer]otive, Manns Choice, [place], [place], MedCost Preferred, Tricare/[place], [place])  [facility] [address] [place], [place], [address]   [place], [place], [address] [phone] phone    [phone] phone [phone] fax    [phone] fax (Call to verify insurance coverage)  [place]. [place], [place], [address] [phone] phone Also does DBT and Addiction (BCBS, [place], [place], and self-pay)  [employer]  (Kelly Services ONLY) [address] [place], [place], [address] [phone] phone ([address], [place], [place], Jabil Circuit, Waterproof, Commercial Met[employer] Company, ConAgra Foods, Tricare, [place], [facility])  [facility] [address], [place], [place], [address] [phone] phone (Call to verify insurance coverage)  [place].   [place], [place], [address]  [place], [place], [address] [phone] phone   [phone] phone (Call to verify insurance coverage)  [address] [place], [place] [address] [phone]  [place] at [facility] [id number] [place], [place] [address] [phone]

## 2022-04-09 NOTE — BH Assessment (Incomplete)
Patient presents to [facility] with his wife reporting worsening depressive symptoms. Pt reports that his PCP was prescribing his psychotic medications and for the past year or so he was taking Xanax 1mg  and Effexor 75mg  however he felt like the medications were no longer working for him so he decided to see a psychiatrist. About three weeks pt seen a NP at [facility] who prescribed him Wellbutrin 150mg , Xanax .25mg  and he was suppose to take Effexor 37.5mg  daily for one week and the second week he was suppose to take it every other day. Pt reports since the medications change things have gotten worse for him. Pt states "I'm not functioning and  reports symptoms of increased crying, anhedonia, loss of motivation, and negative thoughts of "not wanting to be here anymore". Pt states "I just don't feel good". Pt called his PCP and they restarted his old regimen and also encouraged him to come here for additional assistance.  Pt denies plan or intent to harm himself. Pt denies HI, AVH substance use and SIB.

## 2022-04-09 NOTE — BH Assessment (Signed)
LCSW Progress Note   Per Margaretha Seeds, MD, this pt does not require psychiatric hospitalization at this time.  Pt is psychiatrically cleared.  Discharge instructions include several resources for basic mental health services such as outpatient therapy and medication management.  LCSW submitted a referral for PHP/IOP.  EDP Margaretha Seeds, MD,has been notified.  Hansel Starling, MSW, LCSW [facility] [phone] or [phone]

## 2022-04-16 ENCOUNTER — Ambulatory Visit (HOSPITAL_COMMUNITY): Payer: BLUE CROSS/BLUE SHIELD
